# Patient Record
Sex: Male | Born: 1980 | Race: White | Hispanic: No | Marital: Married | Smoking: Current every day smoker
Health system: Southern US, Community
[De-identification: ages and names within clinical notes are randomized; demographics above are authoritative.]

---

## 2009-03-09 ENCOUNTER — Ambulatory Visit: Payer: Self-pay

## 2015-02-05 ENCOUNTER — Ambulatory Visit: Payer: BLUE CROSS/BLUE SHIELD | Attending: Otolaryngology

## 2015-02-05 DIAGNOSIS — R0683 Snoring: Secondary | ICD-10-CM | POA: Insufficient documentation

## 2015-02-05 DIAGNOSIS — R400 Somnolence: Secondary | ICD-10-CM | POA: Diagnosis present

## 2015-12-01 ENCOUNTER — Encounter: Payer: Self-pay | Admitting: Emergency Medicine

## 2015-12-01 ENCOUNTER — Ambulatory Visit: Payer: BLUE CROSS/BLUE SHIELD

## 2015-12-01 ENCOUNTER — Emergency Department
Admission: EM | Admit: 2015-12-01 | Discharge: 2015-12-02 | Disposition: A | Discharge status: Home / Self Care | Payer: BLUE CROSS/BLUE SHIELD | Attending: Emergency Medicine | Admitting: Emergency Medicine

## 2015-12-01 ENCOUNTER — Other Ambulatory Visit: Payer: Self-pay | Admitting: Nurse Practitioner

## 2015-12-01 DIAGNOSIS — K429 Umbilical hernia without obstruction or gangrene: Secondary | ICD-10-CM | POA: Insufficient documentation

## 2015-12-01 DIAGNOSIS — N289 Disorder of kidney and ureter, unspecified: Secondary | ICD-10-CM | POA: Insufficient documentation

## 2015-12-01 DIAGNOSIS — F17210 Nicotine dependence, cigarettes, uncomplicated: Secondary | ICD-10-CM | POA: Diagnosis not present

## 2015-12-01 DIAGNOSIS — R1033 Periumbilical pain: Secondary | ICD-10-CM

## 2015-12-01 DIAGNOSIS — R509 Fever, unspecified: Secondary | ICD-10-CM

## 2015-12-01 DIAGNOSIS — E860 Dehydration: Secondary | ICD-10-CM | POA: Diagnosis not present

## 2015-12-01 DIAGNOSIS — R109 Unspecified abdominal pain: Secondary | ICD-10-CM

## 2015-12-01 DIAGNOSIS — R112 Nausea with vomiting, unspecified: Secondary | ICD-10-CM

## 2015-12-01 LAB — COMPREHENSIVE METABOLIC PANEL
ALBUMIN: 4.5 g/dL (ref 3.5–5.0)
ALK PHOS: 68 U/L (ref 38–126)
ALT: 40 U/L (ref 17–63)
ANION GAP: 6 (ref 5–15)
AST: 30 U/L (ref 15–41)
BILIRUBIN TOTAL: 1.6 mg/dL — AB (ref 0.3–1.2)
BUN: 22 mg/dL — ABNORMAL HIGH (ref 6–20)
CALCIUM: 9.3 mg/dL (ref 8.9–10.3)
CO2: 22 mmol/L (ref 22–32)
Chloride: 107 mmol/L (ref 101–111)
Creatinine, Ser: 2.3 mg/dL — ABNORMAL HIGH (ref 0.61–1.24)
GFR calc Af Amer: 41 mL/min — ABNORMAL LOW (ref >60)
GFR calc non Af Amer: 36 mL/min — ABNORMAL LOW (ref >60)
Glucose, Bld: 85 mg/dL (ref 65–99)
Potassium: 3.8 mmol/L (ref 3.5–5.1)
SODIUM: 135 mmol/L (ref 135–145)
Total Protein: 7.8 g/dL (ref 6.5–8.1)

## 2015-12-01 LAB — URINALYSIS COMPLETE WITH MICROSCOPIC (ARMC ONLY)
Bacteria, UA: NONE SEEN
Bilirubin Urine: NEGATIVE
Glucose, UA: NEGATIVE mg/dL
HGB URINE DIPSTICK: NEGATIVE
Ketones, ur: NEGATIVE mg/dL
Leukocytes, UA: NEGATIVE
NITRITE: NEGATIVE
PH: 5 (ref 5.0–8.0)
PROTEIN: 30 mg/dL — AB
SPECIFIC GRAVITY, URINE: 1.006 (ref 1.005–1.030)
Squamous Epithelial / LPF: NONE SEEN

## 2015-12-01 LAB — CBC
HCT: 49.5 % (ref 40.0–52.0)
HEMOGLOBIN: 16.8 g/dL (ref 13.0–18.0)
MCH: 30.5 pg (ref 26.0–34.0)
MCHC: 34 g/dL (ref 32.0–36.0)
MCV: 89.6 fL (ref 80.0–100.0)
Platelets: 203 10*3/uL (ref 150–440)
RBC: 5.52 MIL/uL (ref 4.40–5.90)
RDW: 13 % (ref 11.5–14.5)
WBC: 14 10*3/uL — ABNORMAL HIGH (ref 3.8–10.6)

## 2015-12-01 LAB — LIPASE, BLOOD: Lipase: 14 U/L (ref 11–51)

## 2015-12-01 NOTE — ED Notes (Signed)
Developed mid abd pain since last pm  States pain radiates into back  Pos n/v

## 2015-12-02 ENCOUNTER — Emergency Department: Payer: BLUE CROSS/BLUE SHIELD

## 2015-12-02 LAB — COMPREHENSIVE METABOLIC PANEL
ALK PHOS: 55 U/L (ref 38–126)
ALT: 31 U/L (ref 17–63)
ANION GAP: 8 (ref 5–15)
AST: 21 U/L (ref 15–41)
Albumin: 3.9 g/dL (ref 3.5–5.0)
BILIRUBIN TOTAL: 1.6 mg/dL — AB (ref 0.3–1.2)
BUN: 23 mg/dL — ABNORMAL HIGH (ref 6–20)
CALCIUM: 8.4 mg/dL — AB (ref 8.9–10.3)
CO2: 21 mmol/L — AB (ref 22–32)
Chloride: 109 mmol/L (ref 101–111)
Creatinine, Ser: 2.05 mg/dL — ABNORMAL HIGH (ref 0.61–1.24)
GFR calc non Af Amer: 41 mL/min — ABNORMAL LOW (ref >60)
GFR, EST AFRICAN AMERICAN: 47 mL/min — AB (ref >60)
Glucose, Bld: 83 mg/dL (ref 65–99)
Potassium: 3.6 mmol/L (ref 3.5–5.1)
SODIUM: 138 mmol/L (ref 135–145)
TOTAL PROTEIN: 6.6 g/dL (ref 6.5–8.1)

## 2015-12-02 MED ORDER — DIATRIZOATE MEGLUMINE & SODIUM 66-10 % PO SOLN
15.0000 mL | ORAL | Status: AC
  Administered 2015-12-02: 15 mL via ORAL
  Administered 2015-12-02: 30 mL via ORAL

## 2015-12-02 MED ORDER — MORPHINE SULFATE (PF) 2 MG/ML IV SOLN
2.0000 mg | Freq: Once | INTRAVENOUS | Status: AC
  Administered 2015-12-02: 2 mg via INTRAVENOUS
  Filled 2015-12-02: qty 1

## 2015-12-02 MED ORDER — SODIUM CHLORIDE 0.9 % IV BOLUS (SEPSIS)
1000.0000 mL | Freq: Once | INTRAVENOUS | Status: AC
  Administered 2015-12-02: 1000 mL via INTRAVENOUS

## 2015-12-02 MED ORDER — ONDANSETRON HCL 4 MG/2ML IJ SOLN
4.0000 mg | Freq: Once | INTRAMUSCULAR | Status: AC
  Administered 2015-12-02: 4 mg via INTRAVENOUS
  Filled 2015-12-02: qty 2

## 2015-12-02 NOTE — ED Provider Notes (Signed)
[facility] Emergency Department Provider Note  ____________________________________________  Time seen: 11:45 PM  I have reviewed the triage vital signs and the nursing notes.   HISTORY  Chief Complaint Abdominal Pain      HPI Lawrence Kline is a 35 y.o. male 5 out of 10. Umbilical abdominal pain that radiates to his back accompanied by vomiting and fever at home temperature 90.9 on presentation to the emergency department. Patient states that his current pain score about 5 but at its maximum was 10 out of 10.     Past medical history No past medical history There are no active problems to display for this patient.  Past surgical history None No current outpatient prescriptions on file.  Allergies No known drug allergies No family history on file.  Social History Social History  Substance Use Topics  . Smoking status: Current Every Day Smoker  . Smokeless tobacco: None  . Alcohol Use: No    Review of Systems  Constitutional: Positive for fever. Eyes: Negative for visual changes. ENT: Negative for sore throat. Cardiovascular: Negative for chest pain. Respiratory: Negative for shortness of breath. Gastrointestinal: Positive for abdominal pain and vomiting  Genitourinary: Negative for dysuria. Musculoskeletal: Negative for back pain. Skin: Negative for rash. Neurological: Negative for headaches, focal weakness or numbness.   10-point ROS otherwise negative.  ____________________________________________   PHYSICAL EXAM:  VITAL SIGNS: ED Triage Vitals  Enc Vitals Group     BP 12/01/15 1835 128/75 mmHg     Pulse Rate 12/01/15 1835 87     Resp 12/01/15 1835 20     Temp 12/01/15 1835 99 F (37.2 C)     Temp Source 12/01/15 1835 Oral     SpO2 12/01/15 1835 97 %     Weight 12/01/15 1835 205 lb (92.987 kg)     Height 12/01/15 1835 5\' 6"  (1.676 m)     Head Cir --      Peak Flow --      Pain Score 12/01/15 1850 5     Pain Loc  --      Pain Edu? --      Excl. in GC? --      Constitutional: Alert and oriented. Well appearing and in no distress. Eyes: Conjunctivae are normal. PERRL. Normal extraocular movements. ENT   Head: Normocephalic and atraumatic.   Nose: No congestion/rhinnorhea.   Mouth/Throat: Mucous membranes are moist.   Neck: No stridor. Hematological/Lymphatic/Immunilogical: No cervical lymphadenopathy. Cardiovascular: Normal rate, regular rhythm. Normal and symmetric distal pulses are present in all extremities. No murmurs, rubs, or gallops. Respiratory: Normal respiratory effort without tachypnea nor retractions. Breath sounds are clear and equal bilaterally. No wheezes/rales/rhonchi. Gastrointestinal: Right lower quadrant/left lower quadrant tenderness palpation. No distention. There is no CVA tenderness. Genitourinary: deferred Musculoskeletal: Nontender with normal range of motion in all extremities. No joint effusions.  No lower extremity tenderness nor edema. Neurologic:  Normal speech and language. No gross focal neurologic deficits are appreciated. Speech is normal.  Skin:  Skin is warm, dry and intact. No rash noted. Psychiatric: Mood and affect are normal. Speech and behavior are normal. Patient exhibits appropriate insight and judgment.  ____________________________________________    LABS (pertinent positives/negatives)  Labs Reviewed  COMPREHENSIVE METABOLIC PANEL - Abnormal; Notable for the following:    BUN 22 (*)    Creatinine, Ser 2.30 (*)    Total Bilirubin 1.6 (*)    GFR calc non Af Amer 36 (*)    GFR calc  Af Amer 41 (*)    All other components within normal limits  CBC - Abnormal; Notable for the following:    WBC 14.0 (*)    All other components within normal limits  URINALYSIS COMPLETEWITH MICROSCOPIC ([facility] ONLY) - Abnormal; Notable for the following:    Color, Urine YELLOW (*)    APPearance CLEAR (*)    Protein, ur 30 (*)    All other components  within normal limits  LIPASE, BLOOD        RADIOLOGY   CT Abdomen Pelvis Wo Contrast (Final result) Result time: 12/02/15 02:39:03   Final result by Rad Results In Interface (12/02/15 02:39:03)   Narrative:   CLINICAL DATA: Mid abdominal pain, onset yesterday evening. Radiating into the back.  EXAM: CT ABDOMEN AND PELVIS WITHOUT CONTRAST  TECHNIQUE: Multidetector CT imaging of the abdomen and pelvis was performed following the standard protocol without IV contrast.  COMPARISON: None.  FINDINGS: There are unremarkable unenhanced appearances of the liver, biliary system, pancreas, spleen, adrenals and kidneys. Ureters and urinary bladder are unremarkable.  There are normal appearances of the stomach, small bowel and colon. The appendix is normal.  The abdominal aorta is normal in caliber. There is no atherosclerotic calcification. There is no adenopathy in the abdomen or pelvis.  No acute inflammatory changes are evident in the abdomen or pelvis. There is no ascites.  There is no significant abnormality in the lower chest.  There is no significant musculoskeletal lesion. There is bilateral spondylolysis at L5. There is a small fat containing umbilical hernia.  IMPRESSION: 1. No acute findings are evident in the abdomen or pelvis. 2. Incidental findings include a small fat containing umbilical hernia, and bilateral L5 spondylolysis.   Electronically Signed By: Ellery Plunk M.D. On: 12/02/2015 02:39        INITIAL IMPRESSION / ASSESSMENT AND PLAN / ED COURSE  Pertinent labs & imaging results that were available during my care of the patient were reviewed by me and considered in my medical decision making (see chart for details).  CT scan of the abdomen revealed umbilical hernia which was small other pathology. Patient noted to have a creatinine of 2.30 which improved after 1 L IV normal saline to 2.0 patient is advised to follow-up with Dr.  Audelia Acton for follow-up creatinine as this may be secondary to dehydration. Patient was advised to drink 2 L of water today.  ____________________________________________   FINAL CLINICAL IMPRESSION(S) / ED DIAGNOSES  Final diagnoses:  Renal insufficiency  Dehydration  Abdominal pain, unspecified abdominal location  Umbilical hernia without obstruction and without gangrene      Darci Current, MD 12/02/15 [phone]

## 2015-12-02 NOTE — Discharge Instructions (Signed)
Creatinine Clearance Test Your creatinine was abnormal today with a value of 2.3 (normal 0.6-1.24) this may be secondary to dehydration. Please follow up with Dr. Harrington [place]ies for repeat laboratory data WHY AM I HAVING THIS TEST? Creatinine clearance (CrCl) is a test that measures how well the kidneys are working.  WHAT KIND OF SAMPLE IS TAKEN? This test may require:  A collection of urine samples over a 24-hour time period.  A blood sample. A blood sample is typically collected by inserting a needle into a vein. WILL I NEED TO COLLECT SAMPLES AT HOME? Your health care provider will provide you with instructions and supplies to collect samples of your urine at home. When collecting urine samples at home, make sure you:  Collect urine in the sterile containers provided to you by the lab.  Discard the first urine sample of the 24-hour period.  Refrigerate all collected samples.  Do not put toilet paper in the urine collection containers.  Collect your urine sample before having a bowel movement. Do not contaminate urine samples with stool.  Collect your last urine sample as close to the end of the 24-hour period as possible. HOW DO I PREPARE FOR THE TEST? Drink plenty of fluids during the 24-hour testing period. Your lab or health care provider may also instruct you to avoid the following during the 24-hour testing period:  Vigorous exercise.  Eating cooked meat.  Drinking tea or coffee.  Taking medicines. Follow your health care provider's instructions carefully. WHAT ARE THE REFERENCE RANGES? Reference ranges are considered healthy ranges established after testing a large group of healthy people. Reference ranges may vary among different people, labs, and hospitals. It is your responsibility to obtain your test results. Ask the lab or department performing the test when and how you will get your results. Reference Ranges for Adults Younger than 4240 Years  Male: 107-139 mL/min or  1.78-2.32 mL/sec (SI units).  Male: 87-107 mL/min or 1.45-1.78 mL/sec (SI units). Values decrease 6.5 mL/min per decade of life after age 34. Reference Ranges for Newborns 40-65 mL/min. WHAT DO THE RESULTS MEAN? Levels above reference ranges are associated with:  Exercise.  Pregnancy.  Syndromes that increase cardiac output. Levels below reference ranges are associated with:  Impaired kidney function.  Conditions that decrease how well your kidneys function. Talk with your health care provider to discuss your results, treatment options, and if necessary, the need for more tests. Talk with your health care provider if you have any questions about your results.   This information is not intended to replace advice given to you by your health care provider. Make sure you discuss any questions you have with your health care provider.   Document Released: 09/07/2004 Document Revised: 09/05/2014 Document Reviewed: 01/09/2014 Elsevier Interactive Patient Education 2016 [employer]  Hernia, Adult A hernia is the bulging of an organ or tissue through a weak spot in the muscles of the abdomen (abdominal wall). Hernias develop most often near the navel or groin. There are many kinds of hernias. Common kinds include:  Femoral hernia. This kind of hernia develops under the groin in the upper thigh area.  Inguinal hernia. This kind of hernia develops in the groin or scrotum.  Umbilical hernia. This kind of hernia develops near the navel.  Hiatal hernia. This kind of hernia causes part of the stomach to be pushed up into the chest.  Incisional hernia. This kind of hernia bulges through a scar from an abdominal surgery. CAUSES This condition  may be caused by:  Heavy lifting.  Coughing over a long period of time.  Straining to have a bowel movement.  An incision made during an abdominal surgery.  A birth defect (congenital defect).  Excess weight or obesity.  Smoking.  Poor  nutrition.  Cystic fibrosis.  Excess fluid in the abdomen.  Undescended testicles. SYMPTOMS Symptoms of a hernia include:  A lump on the abdomen. This is the first sign of a hernia. The lump may become more obvious with standing, straining, or coughing. It may get bigger over time if it is not treated or if the condition causing it is not treated.  Pain. A hernia is usually painless, but it may become painful over time if treatment is delayed. The pain is usually dull and may get worse with standing or lifting heavy objects. Sometimes a hernia gets tightly squeezed in the weak spot (strangulated) or stuck there (incarcerated) and causes additional symptoms. These symptoms may include:  Vomiting.  Nausea.  Constipation.  Irritability. DIAGNOSIS A hernia may be diagnosed with:  A physical exam. During the exam your health care provider may ask you to cough or to make a specific movement, because a hernia is usually more visible when you move.  Imaging tests. These can include:  X-rays.  Ultrasound.  CT scan. TREATMENT A hernia that is small and painless may not need to be treated. A hernia that is large or painful may be treated with surgery. Inguinal hernias may be treated with surgery to prevent incarceration or strangulation. Strangulated hernias are always treated with surgery, because lack of blood to the trapped organ or tissue can cause it to die. Surgery to treat a hernia involves pushing the bulge back into place and repairing the weak part of the abdomen. HOME CARE INSTRUCTIONS  Avoid straining.  Do not lift anything heavier than 10 lb (4.5 kg).  Lift with your leg muscles, not your back muscles. This helps avoid strain.  When coughing, try to cough gently.  Prevent constipation. Constipation leads to straining with bowel movements, which can make a hernia worse or cause a hernia repair to break down. You can prevent constipation by:  Eating a high-fiber diet  that includes plenty of fruits and vegetables.  Drinking enough fluids to keep your urine clear or pale yellow. Aim to drink 6-8 glasses of water per day.  Using a stool softener as directed by your health care provider.  Lose weight, if you are overweight.  Do not use any tobacco products, including cigarettes, chewing tobacco, or electronic cigarettes. If you need help quitting, ask your health care provider.  Keep all follow-up visits as directed by your health care provider. This is important. Your health care provider may need to monitor your condition. SEEK MEDICAL CARE IF:  You have swelling, redness, and pain in the affected area.  Your bowel habits change. SEEK IMMEDIATE MEDICAL CARE IF:  You have a fever.  You have abdominal pain that is getting worse.  You feel nauseous or you vomit.  You cannot push the hernia back in place by gently pressing on it while you are lying down.  The hernia:  Changes in shape or size.  Is stuck outside the abdomen.  Becomes discolored.  Feels hard or tender.   This information is not intended to replace advice given to you by your health care provider. Make sure you discuss any questions you have with your health care provider.   Document Released: 08/15/2005 Document  Revised: 09/05/2014 Document Reviewed: 06/25/2014 Elsevier Interactive Patient Education [employer].

## 2015-12-02 NOTE — ED Notes (Signed)
Patient to CT via stretcher with staff.

## 2017-09-25 ENCOUNTER — Other Ambulatory Visit: Payer: Self-pay | Admitting: Ophthalmology

## 2017-09-25 DIAGNOSIS — G4452 New daily persistent headache (NDPH): Secondary | ICD-10-CM

## 2017-09-29 ENCOUNTER — Other Ambulatory Visit
Admission: RE | Admit: 2017-09-29 | Discharge: 2017-09-29 | Disposition: A | Discharge status: Home / Self Care | Payer: BLUE CROSS/BLUE SHIELD | Source: Ambulatory Visit | Attending: Ophthalmology | Admitting: Ophthalmology

## 2017-09-29 DIAGNOSIS — R42 Dizziness and giddiness: Secondary | ICD-10-CM | POA: Diagnosis not present

## 2017-09-29 DIAGNOSIS — G4452 New daily persistent headache (NDPH): Secondary | ICD-10-CM | POA: Insufficient documentation

## 2017-09-29 LAB — CBC WITH DIFFERENTIAL/PLATELET
Basophils Absolute: 0.1 10*3/uL (ref 0–0.1)
Basophils Relative: 1 %
Eosinophils Absolute: 0.1 10*3/uL (ref 0–0.7)
Eosinophils Relative: 1 %
HEMATOCRIT: 49.4 % (ref 40.0–52.0)
HEMOGLOBIN: 16.7 g/dL (ref 13.0–18.0)
LYMPHS ABS: 2.3 10*3/uL (ref 1.0–3.6)
LYMPHS PCT: 28 %
MCH: 30.9 pg (ref 26.0–34.0)
MCHC: 33.7 g/dL (ref 32.0–36.0)
MCV: 91.7 fL (ref 80.0–100.0)
MONO ABS: 0.6 10*3/uL (ref 0.2–1.0)
Monocytes Relative: 7 %
NEUTROS ABS: 5.2 10*3/uL (ref 1.4–6.5)
NEUTROS PCT: 63 %
Platelets: 213 10*3/uL (ref 150–440)
RBC: 5.39 MIL/uL (ref 4.40–5.90)
RDW: 12.8 % (ref 11.5–14.5)
WBC: 8.3 10*3/uL (ref 3.8–10.6)

## 2017-10-02 LAB — B. BURGDORFI ANTIBODIES: B burgdorferi Ab IgG+IgM: <0.91 {ISR} (ref 0.00–0.90)

## 2017-10-04 ENCOUNTER — Ambulatory Visit: Payer: BLUE CROSS/BLUE SHIELD

## 2017-10-04 ENCOUNTER — Ambulatory Visit
Admission: RE | Admit: 2017-10-04 | Discharge: 2017-10-04 | Disposition: A | Discharge status: Home / Self Care | Payer: BLUE CROSS/BLUE SHIELD | Source: Ambulatory Visit | Attending: Ophthalmology | Admitting: Ophthalmology

## 2017-10-17 ENCOUNTER — Ambulatory Visit
Admission: RE | Admit: 2017-10-17 | Discharge: 2017-10-17 | Disposition: A | Discharge status: Home / Self Care | Payer: BLUE CROSS/BLUE SHIELD | Source: Ambulatory Visit | Attending: Ophthalmology | Admitting: Ophthalmology

## 2017-10-17 DIAGNOSIS — G4452 New daily persistent headache (NDPH): Secondary | ICD-10-CM

## 2017-10-17 DIAGNOSIS — J3489 Other specified disorders of nose and nasal sinuses: Secondary | ICD-10-CM | POA: Diagnosis not present

## 2017-10-17 MED ORDER — GADOBENATE DIMEGLUMINE 529 MG/ML IV SOLN
20.0000 mL | Freq: Once | INTRAVENOUS | Status: AC | PRN
  Administered 2017-10-17: 20 mL via INTRAVENOUS

## 2019-11-08 IMAGING — MR MR MRA HEAD W/O CM
13 series · 45 of 48 positions shown · IV contrast (20mL multihance)
Comparison: None.

CLINICAL DATA: New daily persistent headache for 3-4 months.

EXAM:
MRI HEAD WITHOUT AND WITH CONTRAST
MRA HEAD WITHOUT CONTRAST
TECHNIQUE: Multiplanar, multiecho pulse sequences of the brain and surrounding
structures were obtained without and with intravenous contrast.
Angiographic images of the head were obtained using MRA technique
without contrast.
CONTRAST:  20mL MULTIHANCE GADOBENATE DIMEGLUMINE 529 MG/ML IV SOLN

[Series 2: T1 · sagittal · 5.0mm · 0.45mm/px · 2 of 25 slices shown (1 of 2)]
[im 1/25]
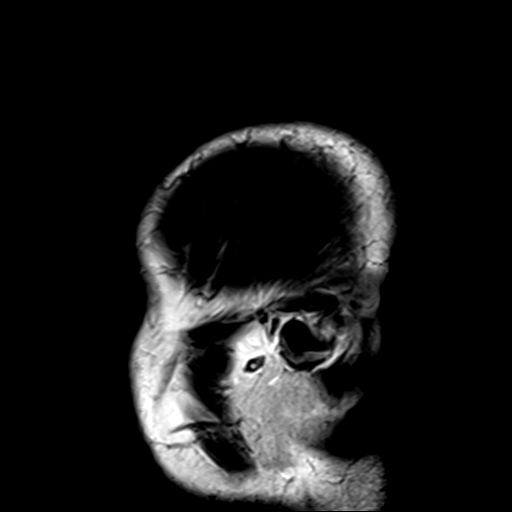
[im 25/25]
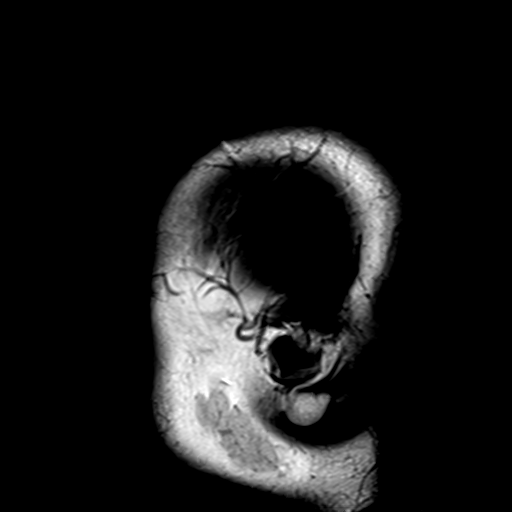

[Series 4: DWI · axial · 3.0mm · 1.80mm/px · z∈[-91,+71]mm · 4 of 55 slices shown (1 of 2)]
[im 1/55]
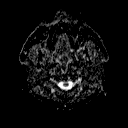
[im 19/55]
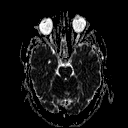
[im 37/55]
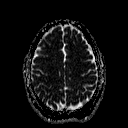
[im 55/55]
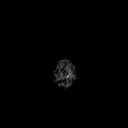

[Series 6: DWI · coronal · 3.0mm · 1.80mm/px · 2 of 45 slices shown (2 of 2)]
[im 1/45]
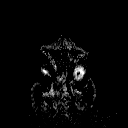
[im 45/45]
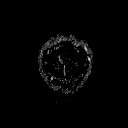

[Series 7: T2 · axial · 5.0mm · 0.60mm/px · 1 of 25 slices shown (1 of 2)]
[im 1/25]
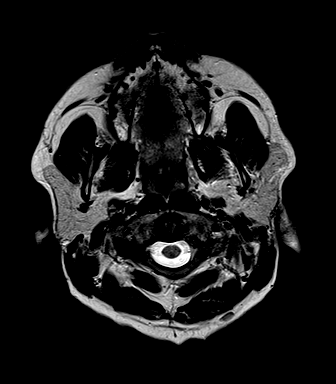

[Series 8: TOF · axial · non-contrast · 0.7mm · 0.37mm/px · z∈[-78,+21]mm · 8 of 143 slices shown]
[im 1/143]
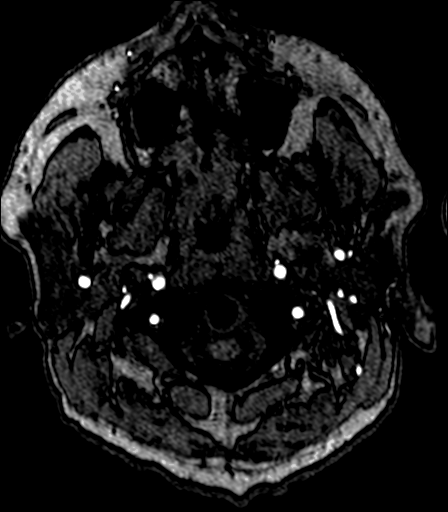
[im 21/143]
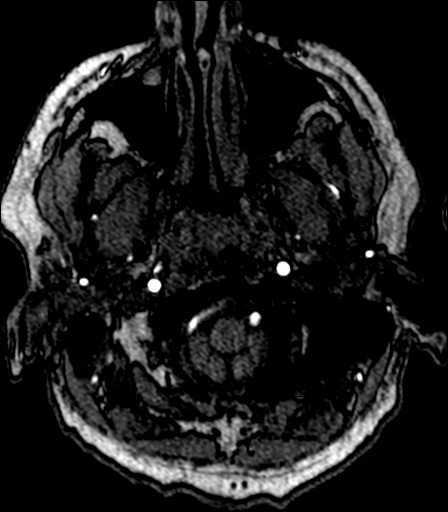
[im 41/143]
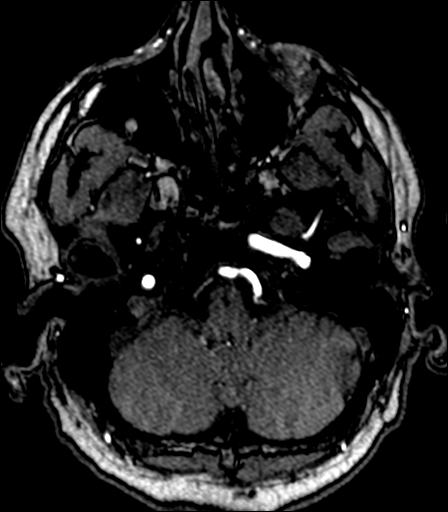
[im 61/143]
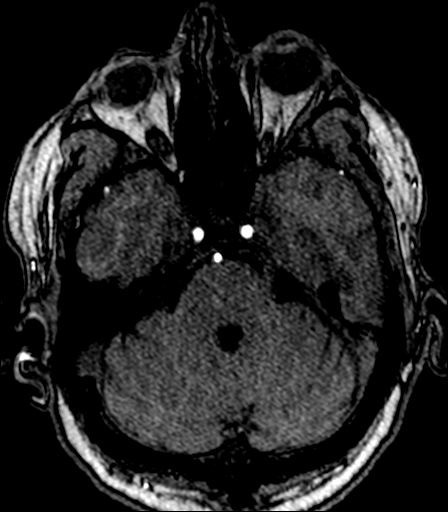
[im 82/143]
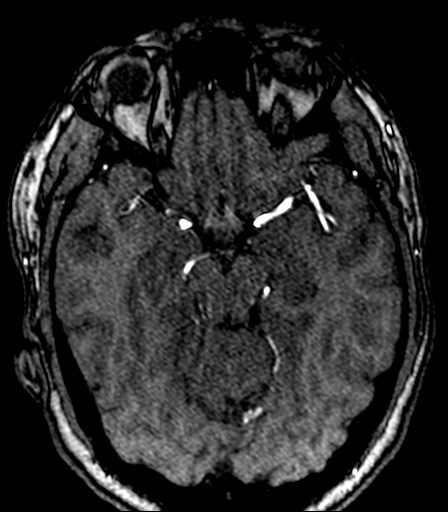
[im 102/143]
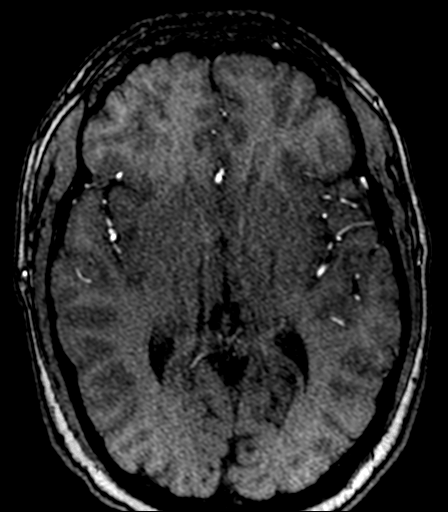
[im 122/143]
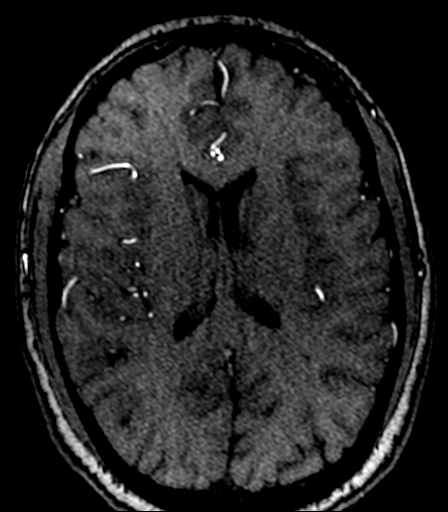
[im 143/143]
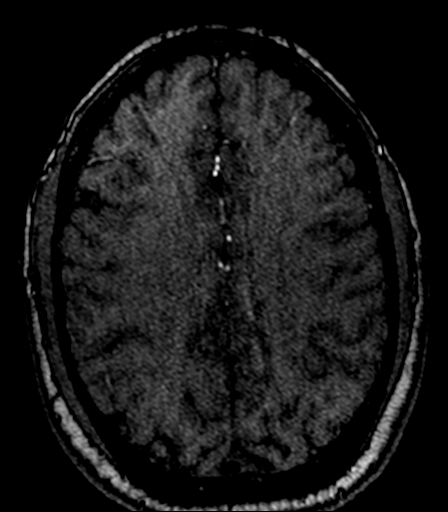

[Series 13: FLAIR · axial · 3.0mm · 0.45mm/px · z∈[-88,+68]mm · 3 of 53 slices shown]
[im 1/53]
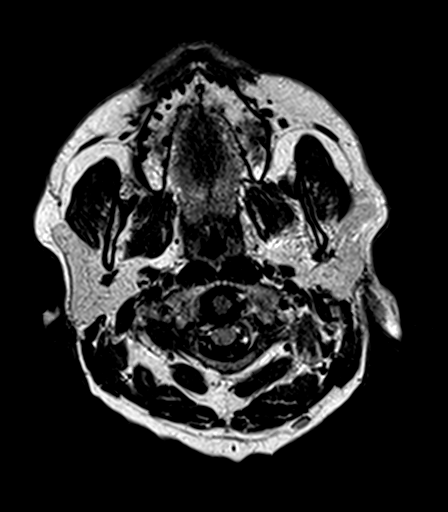
[im 27/53]
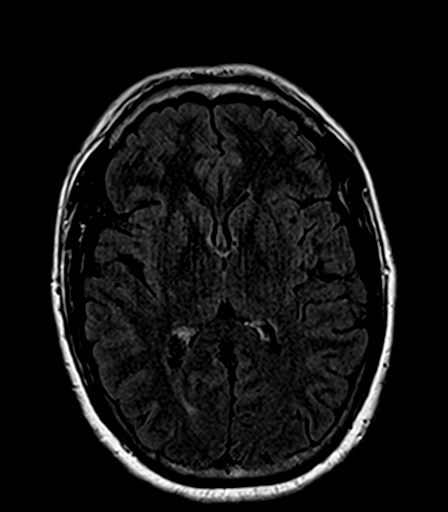
[im 53/53]
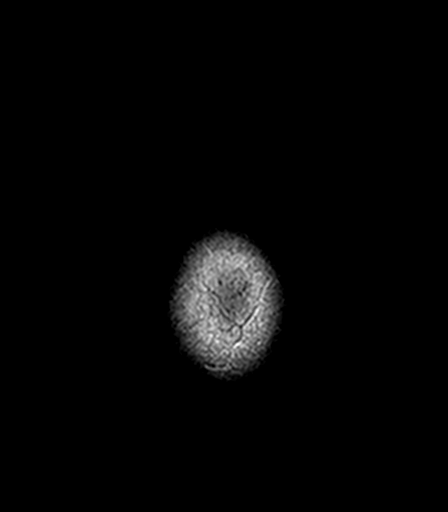

[Series 14: T2 · axial · 5.0mm · 0.45mm/px · 1 of 25 slices shown (2 of 2)]
[im 1/25]
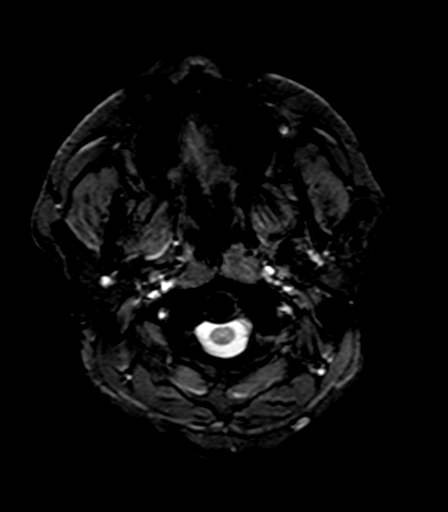

[Series 15: T1 · axial · 1.0mm · 1.00mm/px · z∈[-98,+77]mm · 8 of 176 slices shown (2 of 2)]
[im 1/176]
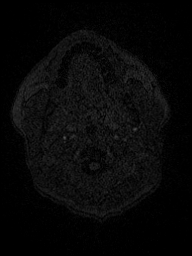
[im 20/176]
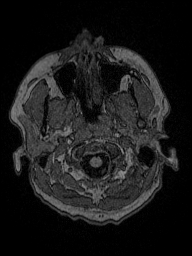
[im 59/176]
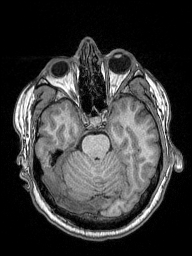
[im 78/176]
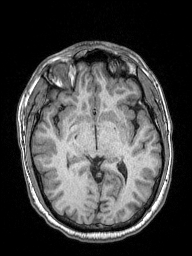
[im 98/176]
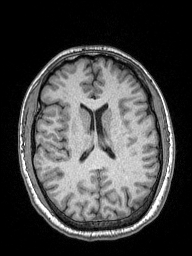
[im 117/176]
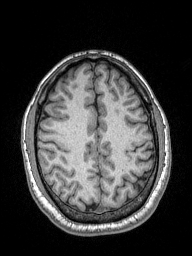
[im 156/176]
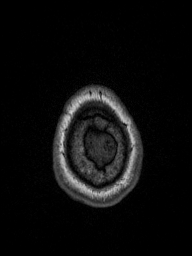
[im 176/176]
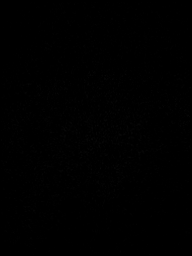

[Series 16: T2 post-contrast · coronal · 5.0mm · 0.49mm/px · 1 of 27 slices shown]
[im 1/27]
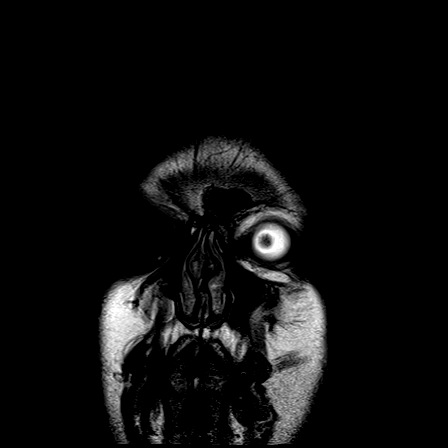

[Series 17: T1 post-contrast · axial · 1.0mm · 1.00mm/px · z∈[-98,+77]mm · 9 of 176 slices shown (1 of 2)]
[im 1/176]
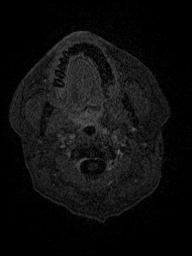
[im 20/176]
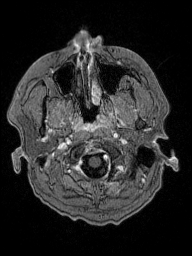
[im 39/176]
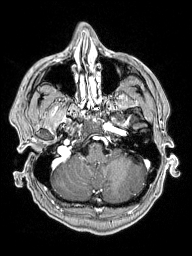
[im 59/176]
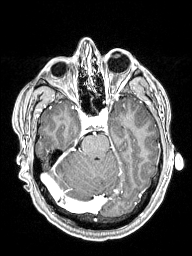
[im 78/176]
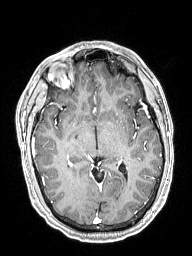
[im 98/176]
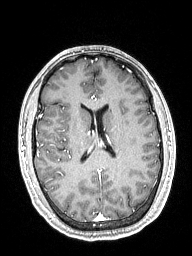
[im 117/176]
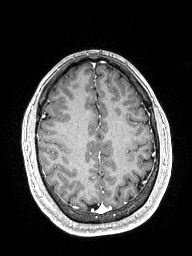
[im 156/176]
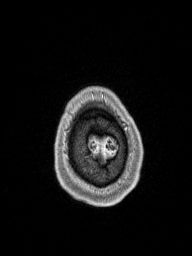
[im 176/176]
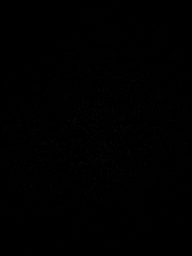

[Series 18: T1 post-contrast · coronal · 5.0mm · 0.43mm/px · 1 of 27 slices shown (2 of 2)]
[im 1/27]
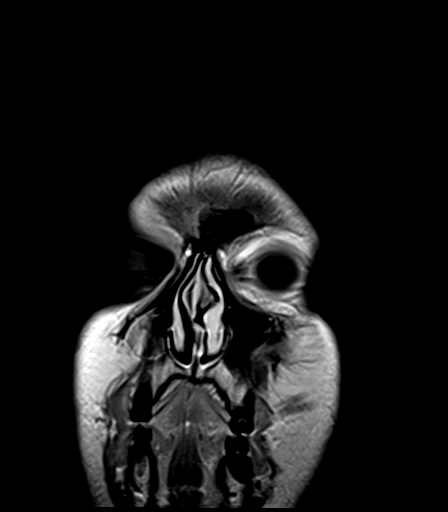

[Series 100: (id) · axial · 3.0mm · 1.80mm/px · z∈[-91,+71]mm · 3 of 54 slices shown]
[im 1/54]
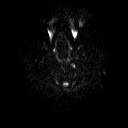
[im 27/54]
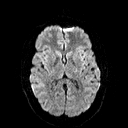
[im 54/54]
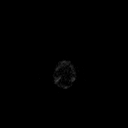

[Series 101: (id) cor · coronal · 3.0mm · 1.80mm/px · 2 of 45 slices shown]
[im 1/45]
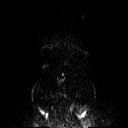
[im 45/45]
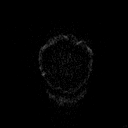

[45 of 48 positions shown; findings below may reference images not displayed]

FINDINGS: MRI HEAD FINDINGS

Brain: 4 or 5 small FLAIR hyperintensities in the bifrontal white
matter, somewhat clustered on the right.

No infarct, hemorrhage, hydrocephalus, or masslike finding. Normal
brain volume.

Vascular: Major flow voids and vascular enhancements are preserved.

Skull and upper cervical spine: Negative

Sinuses/Orbits: 2 small retention cysts in the right maxillary
sinus. Minimal mucosal thickening at the inferior left frontal
sinus.

MRA HEAD FINDINGS

Symmetric carotid arteries. Left vertebral artery dominance. No
unusual branching. No stenosis, beading, or aneurysm. No evidence of
vascular malformation.
IMPRESSION: 1. No specific or reversible explanation for headache.
2. Few nonspecific FLAIR hyperintensities in the cerebral white
matter which can be seen with complicated migraine, trauma, or
remote ischemia/inflammation.
3. Negative intracranial MRA.
4. Mild mucosal thickening at the left frontal ethmoidal recess.

## 2024-05-30 ENCOUNTER — Other Ambulatory Visit: Payer: Self-pay | Admitting: Family Medicine

## 2024-05-30 DIAGNOSIS — R2242 Localized swelling, mass and lump, left lower limb: Secondary | ICD-10-CM

## 2024-06-04 ENCOUNTER — Ambulatory Visit
Admission: RE | Admit: 2024-06-04 | Discharge: 2024-06-04 | Disposition: A | Discharge status: Home / Self Care | Source: Ambulatory Visit | Attending: Family Medicine | Admitting: Family Medicine

## 2024-06-04 DIAGNOSIS — R2242 Localized swelling, mass and lump, left lower limb: Secondary | ICD-10-CM | POA: Diagnosis present

## 2024-06-28 ENCOUNTER — Other Ambulatory Visit: Payer: Self-pay | Admitting: Orthopedic Surgery

## 2024-06-28 DIAGNOSIS — M79605 Pain in left leg: Secondary | ICD-10-CM

## 2024-06-28 DIAGNOSIS — R2242 Localized swelling, mass and lump, left lower limb: Secondary | ICD-10-CM

## 2024-07-01 ENCOUNTER — Encounter: Payer: Self-pay | Admitting: Orthopedic Surgery

## 2024-07-12 ENCOUNTER — Ambulatory Visit
Admission: RE | Admit: 2024-07-12 | Discharge: 2024-07-12 | Disposition: A | Discharge status: Home / Self Care | Source: Ambulatory Visit | Attending: Orthopedic Surgery | Admitting: Orthopedic Surgery

## 2024-07-12 DIAGNOSIS — M79605 Pain in left leg: Secondary | ICD-10-CM

## 2024-07-12 DIAGNOSIS — R2242 Localized swelling, mass and lump, left lower limb: Secondary | ICD-10-CM

## 2024-07-12 MED ORDER — GADOPICLENOL 0.5 MMOL/ML IV SOLN
10.0000 mL | Freq: Once | INTRAVENOUS | Status: AC | PRN
  Administered 2024-07-12: 10 mL via INTRAVENOUS
